# Patient Record
Sex: Male | Born: 1955 | Race: White | Hispanic: No | Marital: Married | Smoking: Never smoker
Health system: Southern US, Community
[De-identification: ages and names within clinical notes are randomized; demographics above are authoritative.]

## PROBLEM LIST (undated history)

## (undated) DIAGNOSIS — I10 Essential (primary) hypertension: Secondary | ICD-10-CM

## (undated) DIAGNOSIS — I2699 Other pulmonary embolism without acute cor pulmonale: Secondary | ICD-10-CM

## (undated) DIAGNOSIS — G4730 Sleep apnea, unspecified: Secondary | ICD-10-CM

## (undated) HISTORY — PX: BACK SURGERY: SHX140

---

## 2003-12-23 ENCOUNTER — Other Ambulatory Visit: Payer: Self-pay

## 2004-02-03 ENCOUNTER — Ambulatory Visit: Payer: Self-pay | Admitting: Physician Assistant

## 2005-05-10 ENCOUNTER — Ambulatory Visit: Payer: Self-pay | Admitting: Gastroenterology

## 2005-05-15 ENCOUNTER — Ambulatory Visit: Payer: Self-pay | Admitting: Family Medicine

## 2005-06-29 ENCOUNTER — Ambulatory Visit: Payer: Self-pay | Admitting: Pain Medicine

## 2005-08-08 ENCOUNTER — Ambulatory Visit: Payer: Self-pay | Admitting: Pain Medicine

## 2006-04-24 ENCOUNTER — Ambulatory Visit: Payer: Self-pay | Admitting: Pain Medicine

## 2006-04-27 ENCOUNTER — Ambulatory Visit: Payer: Self-pay | Admitting: Internal Medicine

## 2007-05-03 ENCOUNTER — Emergency Department: Payer: Self-pay | Admitting: Emergency Medicine

## 2009-04-25 ENCOUNTER — Ambulatory Visit: Payer: Self-pay | Admitting: Internal Medicine

## 2018-11-24 ENCOUNTER — Other Ambulatory Visit: Payer: Self-pay

## 2018-11-24 ENCOUNTER — Ambulatory Visit
Admission: EM | Admit: 2018-11-24 | Discharge: 2018-11-24 | Disposition: A | Discharge status: Home / Self Care | Payer: Medicare Other | Attending: Family Medicine | Admitting: Family Medicine

## 2018-11-24 ENCOUNTER — Ambulatory Visit (INDEPENDENT_AMBULATORY_CARE_PROVIDER_SITE_OTHER): Payer: Medicare Other

## 2018-11-24 ENCOUNTER — Encounter: Payer: Self-pay | Admitting: Emergency Medicine

## 2018-11-24 DIAGNOSIS — R079 Chest pain, unspecified: Secondary | ICD-10-CM

## 2018-11-24 DIAGNOSIS — R0781 Pleurodynia: Secondary | ICD-10-CM | POA: Diagnosis not present

## 2018-11-24 HISTORY — DX: Other pulmonary embolism without acute cor pulmonale: I26.99

## 2018-11-24 HISTORY — DX: Sleep apnea, unspecified: G47.30

## 2018-11-24 HISTORY — DX: Essential (primary) hypertension: I10

## 2018-11-24 NOTE — ED Provider Notes (Signed)
[facility] ____________________________________________  Time seen: Approximately 8:47 AM  I have reviewed the triage vital signs and the nursing notes.   HISTORY  Chief Complaint Rib Pain (left)   HPI Leonard Middleton. is a 63 y.o. male presenting for evaluation of left rib pain.  Patient reports 4 days ago he felt fine, however he bent down to get something off of the floor and in doing this he felt a pop to his left lower rib and has had pain since.  States pain improves when he is standing and being still.  States when he lies on his left ribs, picks things up, moves or if he has to cough or sneeze the area hurts.  States mild pain currently.  Patient states that he believes he had his belt too tight and when he bent forward his stomach tissue pushed into his rib.  That his pain is fully reproducible with movement and palpation.  Denies any chest pain, palpitation, shortness of breath.  Felt fine prior to this movement.  No pain radiation.  Denies abdominal pain.  No recent sickness, fevers, more than occasional cough, or other changes.  Reports otherwise doing well.  States he is concerned that he may have broken a rib.  PCP: [facility]  Past Medical History:  Diagnosis Date  . Hypertension   . Pulmonary embolism ([place])   . Sleep apnea     There are no active problems to display for this patient.   Past Surgical History:  Procedure Laterality Date  . BACK SURGERY       No current facility-administered medications for this encounter.   Current Outpatient Medications:  .  buPROPion (WELLBUTRIN SR) 200 MG 12 hr tablet, Take by mouth., Disp: , Rfl:  .  fluticasone (FLONASE) 50 MCG/ACT nasal spray, Place into the nose., Disp: , Rfl:  .  hydrochlorothiazide (HYDRODIURIL) 25 MG tablet, , Disp: , Rfl:  .  losartan (COZAAR) 25 MG tablet, Take by mouth., Disp: , Rfl:  .  Misc Natural Products (BETA-SITOSTEROL PLANT STEROLS PO), Take by mouth., Disp: , Rfl:  .   simvastatin (ZOCOR) 40 MG tablet, , Disp: , Rfl:   Allergies Morphine  Family History  Problem Relation Age of Onset  . Hypertension Mother   . Cancer Father   . Hypertension Father     Social History Social History   Tobacco Use  . Smoking status: Never Smoker  . Smokeless tobacco: Never Used  Substance Use Topics  . Alcohol use: Not Currently  . Drug use: Never    Review of Systems Constitutional: No fever ENT: No sore throat. Cardiovascular: Denies chest pain. Respiratory: Denies shortness of breath. Gastrointestinal: No abdominal pain.  No nausea, no vomiting.  No diarrhea. Genitourinary: Negative for dysuria. Musculoskeletal: Negative for back pain.  Positive rib pain. Skin: Negative for rash.   ____________________________________________   PHYSICAL EXAM:  VITAL SIGNS: ED Triage Vitals  Enc Vitals Group     BP 11/24/18 0834 (!) 165/102     Pulse Rate 11/24/18 0834 96     Resp 11/24/18 0834 18     Temp 11/24/18 0834 98.1 F (36.7 C)     Temp Source 11/24/18 0834 Oral     SpO2 11/24/18 0834 99 %     Weight 11/24/18 0828 (!) 323 lb (146.5 kg)     Height 11/24/18 0828 5\' 7"  (1.702 m)     Head Circumference --      Peak Flow --  Pain Score 11/24/18 0828 6     Pain Loc --      Pain Edu? --      Excl. in GC? --     Constitutional: Alert and oriented. Well appearing and in no acute distress. Eyes: Conjunctivae are normal.  ENT      Head: Normocephalic and atraumatic. Cardiovascular: Normal rate, regular rhythm. Grossly normal heart sounds.  Good peripheral circulation. Respiratory: Normal respiratory effort without tachypnea nor retractions. Breath sounds are clear and equal bilaterally. No wheezes, rales, rhonchi. Gastrointestinal: Soft and nontender. No CVA tenderness.  Morbidly obese abdomen. Musculoskeletal:No midline cervical, thoracic or lumbar tenderness to palpation.  Steady gait. Except: Left anterior lateral lower rib tenderness to direct  palpation on ribs, pain fully reproducible per patient by direct palpation as well as bending and overhead stretching in room, no ecchymosis, no abdominal tenderness.  Neurologic:  Normal speech and language. Speech is normal. No gait instability.  Skin:  Skin is warm, dry and intact. No rash noted. Psychiatric: Mood and affect are normal. Speech and behavior are normal. Patient exhibits appropriate insight and judgment   ___________________________________________   LABS (all labs ordered are listed, but only abnormal results are displayed)  Labs Reviewed - No data to display ____________________________________________  RADIOLOGY  Dg Ribs Unilateral W/chest Left  Result Date: 11/24/2018 CLINICAL DATA:  Anterior and lower rib pain after injury EXAM: LEFT RIBS AND CHEST - 3+ VIEW COMPARISON:  Chest CT 04/27/2006 report FINDINGS: No fracture or other bone lesions are seen involving the ribs. There is no evidence of pneumothorax or pleural effusion. Both lungs are clear. Normal heart size. Hiatal hernia. Prominent degenerative spurring at the left glenohumeral joint. IMPRESSION: 1. Negative left rib series. 2. Hiatal hernia. Electronically Signed   By: Marnee SpringJonathon  Watts M.D.   On: 11/24/2018 09:35   ____________________________________________   PROCEDURES Procedures   INITIAL IMPRESSION / ASSESSMENT AND PLAN / ED COURSE  Pertinent labs & imaging results that were available during my care of the patient were reviewed by me and considered in my medical decision making (see chart for details).  Well-appearing patient.  Left rib pain after he bent down and felt a pop with onset of pain.  Left lower rib pain with direct palpation and fully reproducible with palpation as well as movements in room.  Patient denies any chest pain or shortness of breath.  Denies any abdominal pain.  Left rib x-ray as above per radiologist, reviewed by myself, negative left rib series, hiatal hernia.  Discussed  findings in detail with patient.  Encouraged rest, ice, supportive care, avoidance of strenuous activity and follow-up with primary as needed.  Patient declined need for pain medication.  Deep breaths.  Discussed follow up with Primary care physician this week. Discussed follow up and return parameters including no resolution or any worsening concerns. Patient verbalized understanding and agreed to plan.   ____________________________________________   FINAL CLINICAL IMPRESSION(S) / ED DIAGNOSES  Final diagnoses:  Rib pain on left side     ED Discharge Orders    None       Note: This dictation was prepared with Dragon dictation along with smaller phrase technology. Any transcriptional errors that result from this process are unintentional.         Renford DillsMiller, Ileigh Mettler, NP 11/24/18 1111

## 2018-11-24 NOTE — ED Triage Notes (Signed)
Patient states that four days ago he was sitting in a chair and when he went to bend over he felt a sharp pain on the left side of his rib cage.  Patient c/o ongoing pain on the left side of his rib cage.  Patient denies any cough or fevers.

## 2018-11-24 NOTE — Discharge Instructions (Signed)
Rest. Deep breaths. Ice.  Avoid strenuous lifting or overhead activity.  Follow up with your primary care physician this week as needed. Return to Urgent care for new or worsening concerns.

## 2020-06-01 IMAGING — CR LEFT RIBS AND CHEST - 3+ VIEW
5 series · 5 of 5 positions shown · non-contrast
Comparison: Chest CT 04/27/2006 report

CLINICAL DATA: Anterior and lower rib pain after injury

EXAM:
LEFT RIBS AND CHEST - 3+ VIEW

[chest pa]
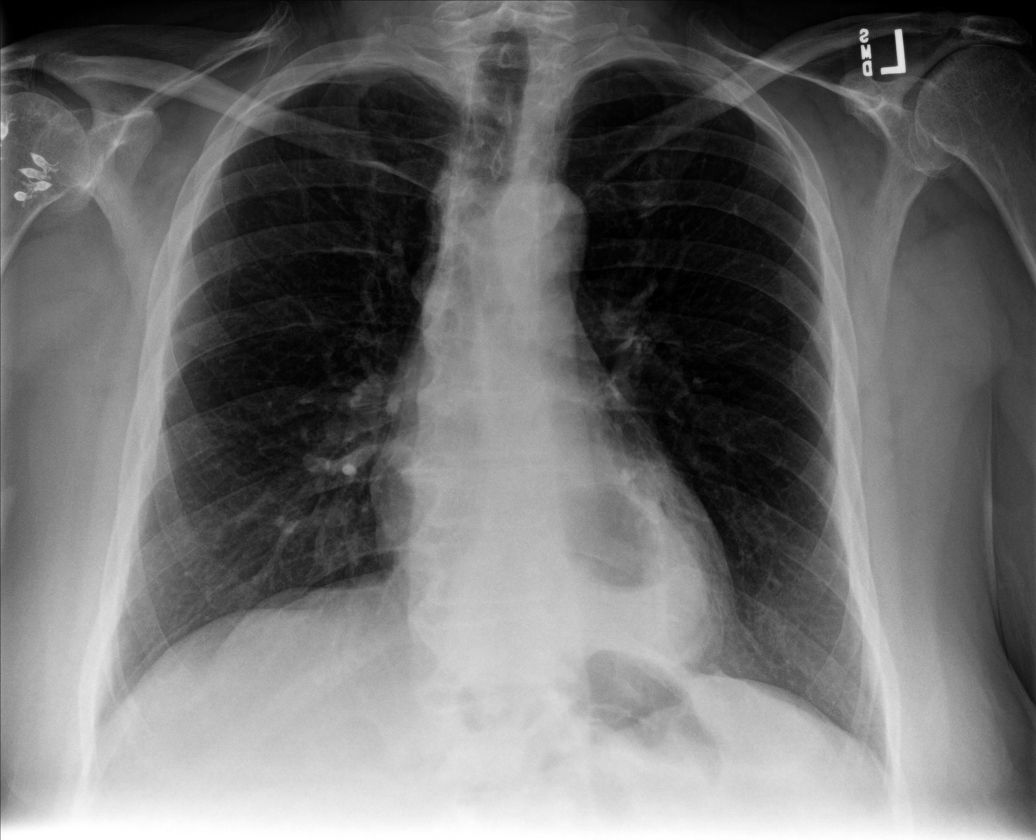

[rib pa (1 of 2)]
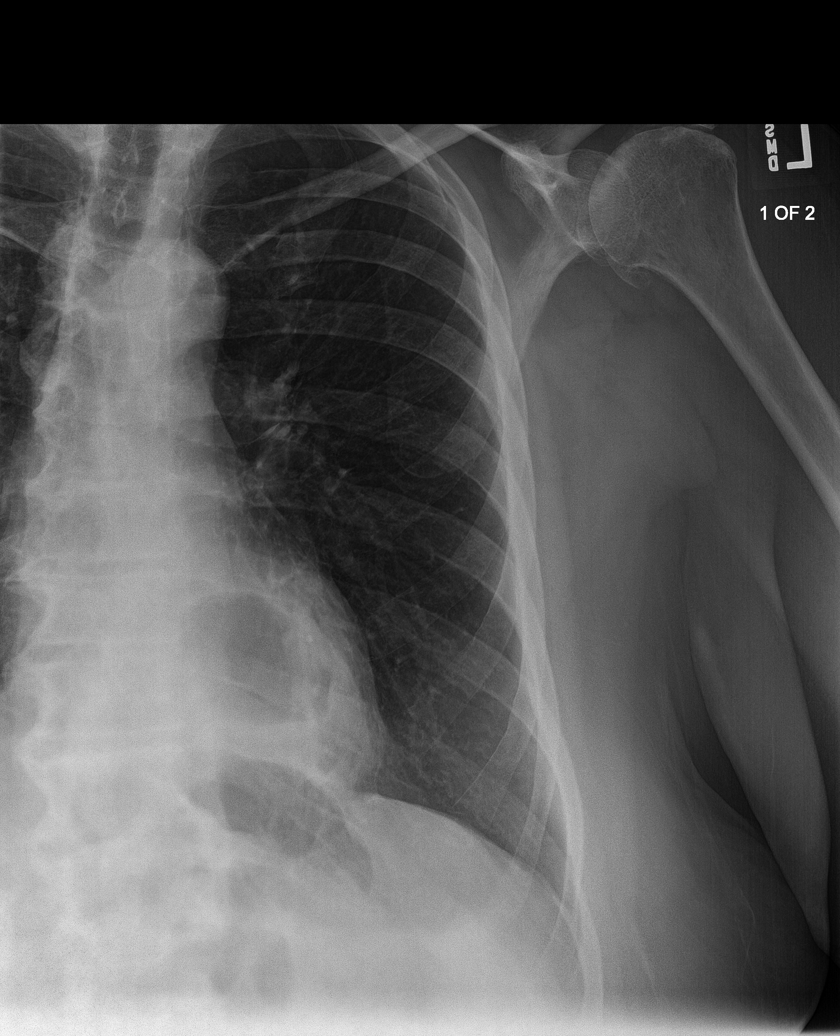

[rib pa (2 of 2)]
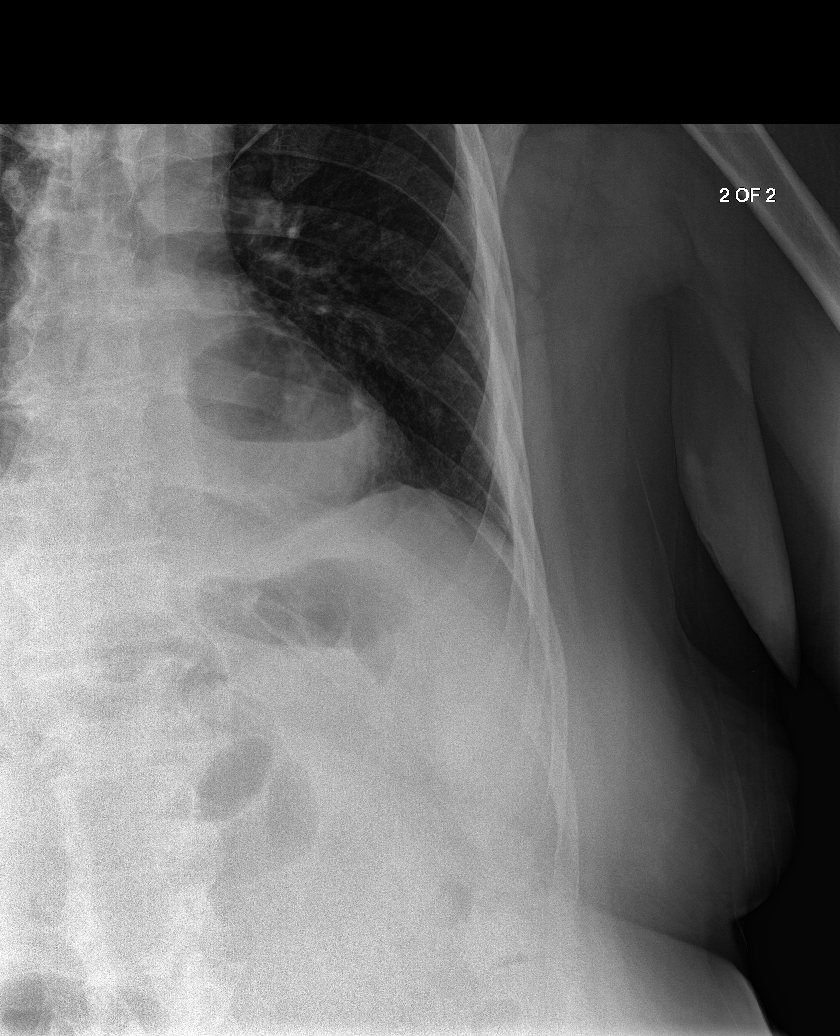

[rib obl (1 of 2)]
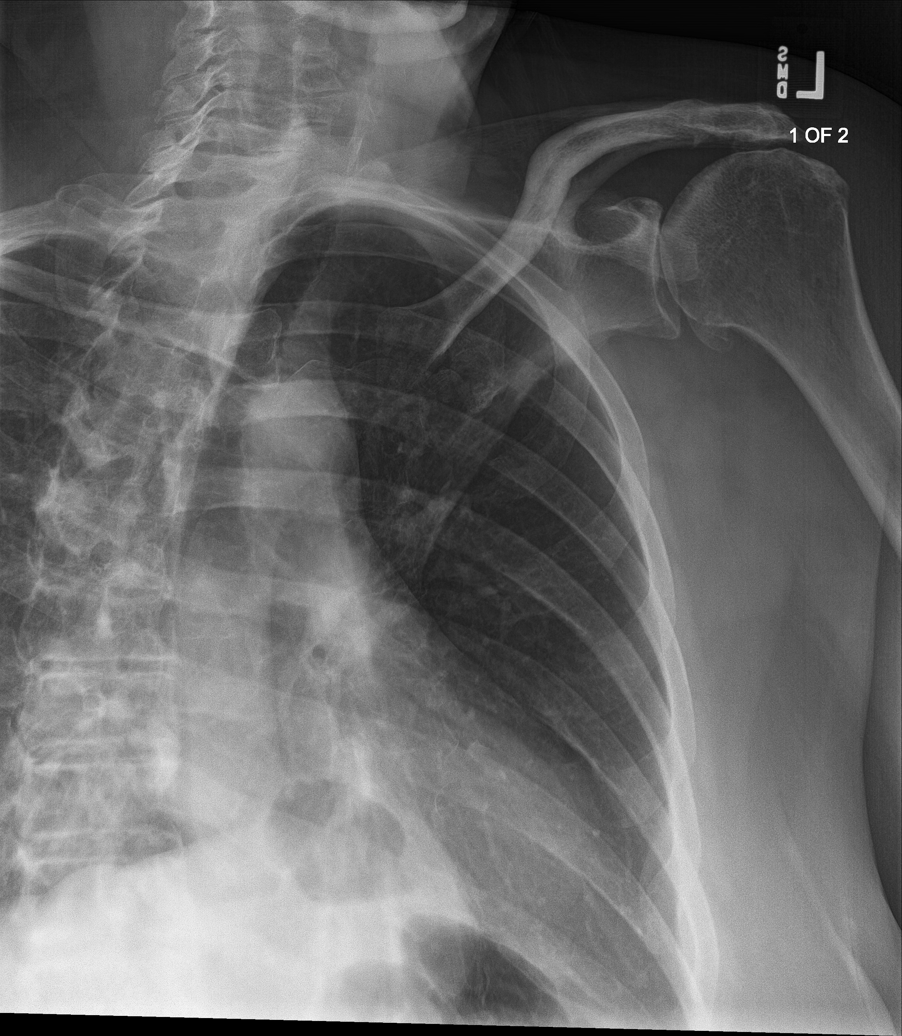

[rib obl (2 of 2)]
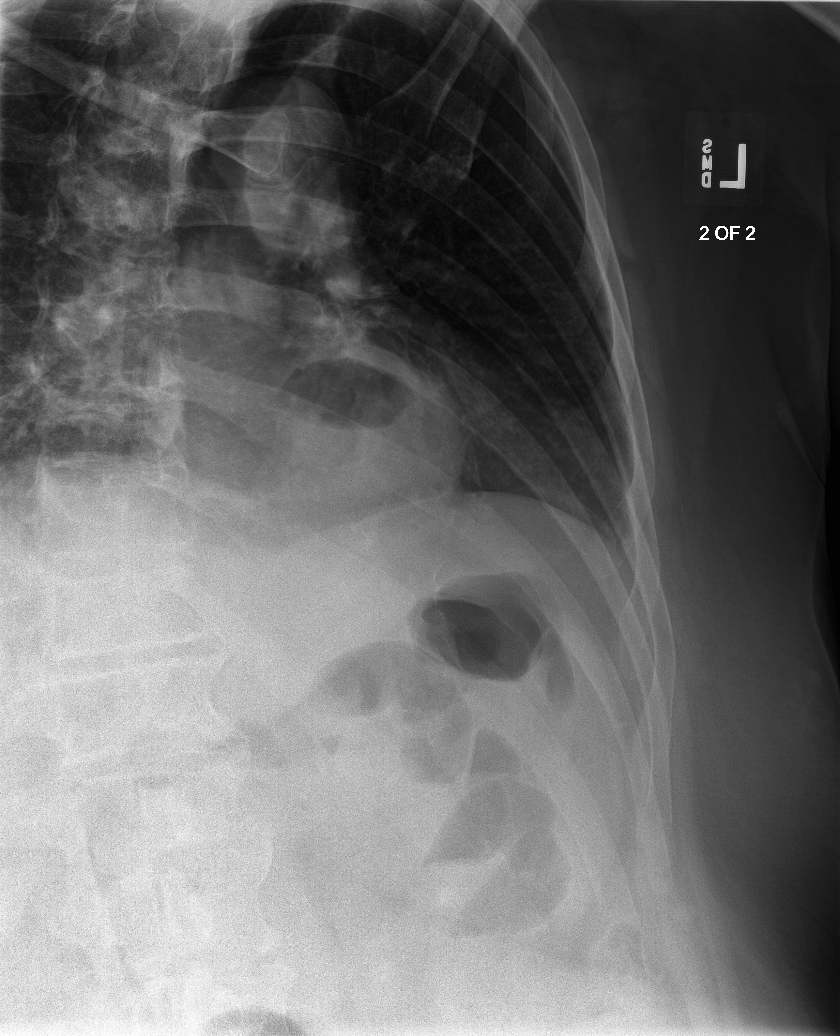

[5 of 5 positions shown; findings below may reference images not displayed]

FINDINGS: No fracture or other bone lesions are seen involving the ribs. There
is no evidence of pneumothorax or pleural effusion. Both lungs are
clear. Normal heart size. Hiatal hernia. Prominent degenerative
spurring at the left glenohumeral joint.
IMPRESSION: 1. Negative left rib series.
2. Hiatal hernia.

## 2023-04-24 ENCOUNTER — Encounter: Payer: Self-pay | Admitting: Emergency Medicine

## 2023-04-24 ENCOUNTER — Ambulatory Visit
Admission: EM | Admit: 2023-04-24 | Discharge: 2023-04-24 | Disposition: A | Discharge status: Home / Self Care | Payer: Medicare Other | Attending: Emergency Medicine | Admitting: Emergency Medicine

## 2023-04-24 DIAGNOSIS — U071 COVID-19: Secondary | ICD-10-CM | POA: Diagnosis present

## 2023-04-24 LAB — RESP PANEL BY RT-PCR (RSV, FLU A&B, COVID)  RVPGX2
Influenza A by PCR: NEGATIVE
Influenza B by PCR: NEGATIVE
Resp Syncytial Virus by PCR: NEGATIVE
SARS Coronavirus 2 by RT PCR: POSITIVE — AB

## 2023-04-24 MED ORDER — FLUTICASONE PROPIONATE 50 MCG/ACT NA SUSP: 2.0000 | Freq: Every day | NASAL | 0 refills | Status: AC

## 2023-04-24 MED ORDER — BENZONATATE 200 MG PO CAPS: 200.0000 mg | ORAL_CAPSULE | Freq: Three times a day (TID) | ORAL | 0 refills | Status: AC | PRN

## 2023-04-24 MED ORDER — MOLNUPIRAVIR EUA 200MG CAPSULE: 4.0000 | ORAL_CAPSULE | Freq: Two times a day (BID) | ORAL | 0 refills | Status: AC

## 2023-04-24 NOTE — ED Provider Notes (Signed)
 HPI  SUBJECTIVE:  Leonard Middleton. is a 68 y.o. male who presents with 2 days of nasal congestion, clear rhinorrhea, fatigue, headache, postnasal drip, sore throat, dry cough.  His wife was diagnosed with COVID-pneumonia yesterday.  No body aches, wheezing, shortness of breath, dyspnea on exertion, chest pain, nausea, vomiting, diarrhea, abdominal pain.  He got 2 doses of the COVID-vaccine.  He tried increasing fluids and taking Tylenol.  Increased fluids helped.  No aggravating factors.  No antipyretic in the past 6 hours.  Patient has a past medical history of hypertension, PE on Eliquis, hypercholesterolemia, aortic stenosis, OSA on BiPAP, BMI above 30.  PCP: [facility].  Past Medical History:  Diagnosis Date   Hypertension    Pulmonary embolism ([facility])    Sleep apnea     Past Surgical History:  Procedure Laterality Date   BACK SURGERY      Family History  Problem Relation Age of Onset   Hypertension Mother    Cancer Father    Hypertension Father     Social History   Tobacco Use   Smoking status: Never   Smokeless tobacco: Never  Vaping Use   Vaping status: Never Used  Substance Use Topics   Alcohol use: Not Currently   Drug use: Never    No current facility-administered medications for this encounter.  Current Outpatient Medications:    benzonatate  (TESSALON ) 200 MG capsule, Take 1 capsule (200 mg total) by mouth 3 (three) times daily as needed for cough., Disp: 30 capsule, Rfl: 0   ELIQUIS 5 MG TABS tablet, 1 tablet (5 mg total) two (2) times a day., Disp: , Rfl:    fluticasone  (FLONASE ) 50 MCG/ACT nasal spray, Place 2 sprays into both nostrils daily., Disp: 16 g, Rfl: 0   hydrochlorothiazide (HYDRODIURIL) 25 MG tablet, , Disp: , Rfl:    metoprolol succinate (TOPROL-XL) 25 MG 24 hr tablet, Take 1 tablet by mouth daily., Disp: , Rfl:    molnupiravir  EUA (LAGEVRIO ) 200 mg CAPS capsule, Take 4 capsules (800 mg total) by mouth 2 (two) times daily for 5 days., Disp:  40 capsule, Rfl: 0   buPROPion (WELLBUTRIN SR) 200 MG 12 hr tablet, Take by mouth., Disp: , Rfl:    losartan (COZAAR) 25 MG tablet, Take by mouth., Disp: , Rfl:    Misc Natural Products (BETA-SITOSTEROL PLANT STEROLS PO), Take by mouth., Disp: , Rfl:    simvastatin (ZOCOR) 40 MG tablet, , Disp: , Rfl:   Allergies  Allergen Reactions   Morphine Rash   Shrimp Extract     Itching and face red, has had shrimp since with no reaction     ROS  As noted in HPI.   Physical Exam  BP (!) 170/92 (BP Location: Right Arm)   Temp 99.2 F (37.3 C) (Oral)   Resp 18   SpO2 100%   BP Readings from Last 3 Encounters:  04/24/23 (!) 170/92  11/24/18 (!) 165/102   General: In no acute distress HENT: Normocephalic, atraumatic,mucus membranes moist.  Positive nasal congestion.  Very mild maxillary sinus tenderness.  Normal oropharynx.  No postnasal drip. Neck: No cervical lymphadenopathy Respiratory: Normal inspiratory effort, lungs clear bilaterally Cardiovascular: Normal rate, regular rhythm.  Positive systolic ejection murmur over the aortic valve. GI: nondistended skin: No rash, skin intact Musculoskeletal: no deformities Neurologic: Alert & oriented x 3, no focal neuro deficits Psychiatric: Speech and behavior appropriate   ED Course   Medications - No data to display  Orders Placed This Encounter  Procedures   Resp panel by RT-PCR (RSV, Flu A&B, Covid) Anterior Nasal Swab    Standing Status:   Standing    Number of Occurrences:   1    Results for orders placed or performed during the hospital encounter of 04/24/23 (from the past 24 hours)  Resp panel by RT-PCR (RSV, Flu A&B, Covid) Anterior Nasal Swab     Status: Abnormal   Collection Time: 04/24/23  1:13 PM   Specimen: Anterior Nasal Swab  Result Value Ref Range   SARS Coronavirus 2 by RT PCR POSITIVE (A) NEGATIVE   Influenza A by PCR NEGATIVE NEGATIVE   Influenza B by PCR NEGATIVE NEGATIVE   Resp Syncytial Virus by PCR  NEGATIVE NEGATIVE   No results found.  ED Clinical Impression  1. COVID-19 virus infection      ED Assessment/Plan     COVID-positive.  Patient is for antivirals based on multiple medical comorbidities.  Will send home with molnupiravir  since Paxlovid interacts with the Eliquis saline nasal rogation, Flonase , Tylenol, Tessalon  as needed for cough.  Do not want to get into something more sedating since he has severe sleep apnea.  Follow-up with PCP as needed.  ER return precautions given.  Discussed labs, MDM, treatment plan, and plan for follow-up with patient. Discussed sn/sx that should prompt return to the ED. patient agrees with plan.   Meds ordered this encounter  Medications   molnupiravir  EUA (LAGEVRIO ) 200 mg CAPS capsule    Sig: Take 4 capsules (800 mg total) by mouth 2 (two) times daily for 5 days.    Dispense:  40 capsule    Refill:  0   fluticasone  (FLONASE ) 50 MCG/ACT nasal spray    Sig: Place 2 sprays into both nostrils daily.    Dispense:  16 g    Refill:  0   benzonatate  (TESSALON ) 200 MG capsule    Sig: Take 1 capsule (200 mg total) by mouth 3 (three) times daily as needed for cough.    Dispense:  30 capsule    Refill:  0      *This clinic note was created using Scientist, clinical (histocompatibility and immunogenetics). Therefore, there may be occasional mistakes despite careful proofreading.  ?    Van Knee, MD 04/27/23 1551

## 2023-04-24 NOTE — ED Triage Notes (Signed)
 Pt presents with a cough, congestion, headache and drainage since yesterday. His wife tested positive for Covid.

## 2023-04-24 NOTE — Discharge Instructions (Signed)
 Molnupiravir  will help reduce the risk that this progresses to his severe illness.  Tylenol 1000 mg 3-4 times a day.  Tessalon  for cough.  Flonase  for nasal congestion. Use a NeilMed sinus rinse with distilled water as often as you want to to reduce nasal congestion. Follow the directions on the box.   Go to www.goodrx.com to look up your medications. This will give you a list of where you can find your prescriptions at the most affordable prices. Or you can ask the pharmacist what the cash price is. This is frequently cheaper than going through insurance.
